# Patient Record
Sex: Female | Born: 1995 | Race: Black or African American | Hispanic: No | Marital: Single | Smoking: Current every day smoker
Health system: Southern US, Community
[De-identification: ages and names within clinical notes are randomized; demographics above are authoritative.]

## PROBLEM LIST (undated history)

## (undated) DIAGNOSIS — E079 Disorder of thyroid, unspecified: Secondary | ICD-10-CM

## (undated) HISTORY — PX: MOUTH SURGERY: SHX715

## (undated) HISTORY — DX: Disorder of thyroid, unspecified: E07.9

---
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
## 2014-01-28 ENCOUNTER — Ambulatory Visit: Payer: Medicaid Other | Admitting: Obstetrics & Gynecology

## 2014-06-06 ENCOUNTER — Ambulatory Visit: Payer: Medicaid Other | Admitting: Obstetrics & Gynecology

## 2014-08-20 ENCOUNTER — Encounter (HOSPITAL_COMMUNITY): Payer: Self-pay | Admitting: Emergency Medicine

## 2014-08-20 ENCOUNTER — Emergency Department (HOSPITAL_COMMUNITY)
Admission: EM | Admit: 2014-08-20 | Discharge: 2014-08-20 | Disposition: A | Discharge status: Home / Self Care | Payer: BC Managed Care – PPO | Source: Home / Self Care | Attending: Family Medicine | Admitting: Family Medicine

## 2014-08-20 DIAGNOSIS — S90852A Superficial foreign body, left foot, initial encounter: Secondary | ICD-10-CM

## 2014-08-20 NOTE — Discharge Instructions (Signed)
Sliver Removal °You have had a sliver (splinter) removed. This has caused a wound that extends through some or all layers of the skin and possibly into the subcutaneous tissue. This is the tissue just beneath the skin. Because these wounds can not be cleaned well, it is necessary to watch closely for infection. °AFTER THE PROCEDURE  °If a cut (incision) was necessary to remove this, it may have been repaired for you by your caregiver either with suturing, stapling, or adhesive strips. These keep together the skin edges and allow better and faster healing. °HOME CARE INSTRUCTIONS  °· A dressing may have been applied. This may be changed once per day or as instructed. If the dressing sticks, it may be soaked off with a gauze pad or clean cloth that has been dampened with soapy water or hydrogen peroxide. °· It is difficult to remove all slivers or foreign bodies as they may break or splinter into smaller pieces. Be aware that your body will work to remove the foreign substance. That is, the foreign body may work itself out of the wound. That is normal. °· Watch for signs of infection and notify your caregiver if you suspect a sliver or foreign body remains in the wound. °· You may have received a recommendation to follow up with your physician or a specialist. It is very important to call for or keep follow-up appointments in order to avoid infection or other complications. °· Only take over-the-counter or prescription medicines for pain, discomfort, or fever as directed by your caregiver. °· If antibiotics were prescribed, be sure to finish all of the medicine. °If you did not receive a tetanus shot today because you did not recall when your last one was given, check with your caregiver in the next day or two during follow up to determine if one is needed. °SEEK MEDICAL CARE IF:  °· The area around the wound has new or worsening redness or tenderness. °· Pus is coming from the wound °· There is a foul smell from the  wound or dressing °· The edges of a wound that had been repaired break open °SEEK IMMEDIATE MEDICAL CARE IF:  °· Red streaks are coming from the wound °· An unexplained oral temperature above 102° F (38.9° C) develops. °Document Released: 08/27/2000 Document Revised: 11/22/2011 Document Reviewed: 04/15/2008 °ExitCare® Patient Information ©2015 [employer]. This information is not intended to replace advice given to you by your health care provider. Make sure you discuss any questions you have with your health care provider. ° °

## 2014-08-20 NOTE — ED Notes (Signed)
Reports stepping on glass one month ago.  States having pain and swelling in left heel.
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
## 2014-09-09 ENCOUNTER — Encounter: Payer: Self-pay | Admitting: *Deleted

## 2014-09-10 ENCOUNTER — Encounter: Payer: Self-pay | Admitting: Obstetrics & Gynecology

## 2014-11-26 ENCOUNTER — Encounter (HOSPITAL_COMMUNITY): Payer: Self-pay | Admitting: Emergency Medicine

## 2014-11-26 ENCOUNTER — Emergency Department (HOSPITAL_COMMUNITY)
Admission: EM | Admit: 2014-11-26 | Discharge: 2014-11-26 | Disposition: A | Discharge status: Home / Self Care | Payer: BLUE CROSS/BLUE SHIELD | Attending: Emergency Medicine | Admitting: Emergency Medicine

## 2014-11-26 DIAGNOSIS — Y9389 Activity, other specified: Secondary | ICD-10-CM | POA: Diagnosis not present

## 2014-11-26 DIAGNOSIS — S199XXA Unspecified injury of neck, initial encounter: Secondary | ICD-10-CM | POA: Diagnosis present

## 2014-11-26 DIAGNOSIS — Y998 Other external cause status: Secondary | ICD-10-CM | POA: Insufficient documentation

## 2014-11-26 DIAGNOSIS — S161XXA Strain of muscle, fascia and tendon at neck level, initial encounter: Secondary | ICD-10-CM | POA: Insufficient documentation

## 2014-11-26 DIAGNOSIS — Z79899 Other long term (current) drug therapy: Secondary | ICD-10-CM | POA: Insufficient documentation

## 2014-11-26 DIAGNOSIS — Z8639 Personal history of other endocrine, nutritional and metabolic disease: Secondary | ICD-10-CM | POA: Diagnosis not present

## 2014-11-26 DIAGNOSIS — Y92410 Unspecified street and highway as the place of occurrence of the external cause: Secondary | ICD-10-CM | POA: Diagnosis not present

## 2014-11-26 DIAGNOSIS — T148XXA Other injury of unspecified body region, initial encounter: Secondary | ICD-10-CM

## 2014-11-26 MED ORDER — IBUPROFEN 800 MG PO TABS: 800.0000 mg | ORAL_TABLET | Freq: Three times a day (TID) | ORAL | Status: DC

## 2014-11-26 MED ORDER — CYCLOBENZAPRINE HCL 10 MG PO TABS: 10.0000 mg | ORAL_TABLET | Freq: Two times a day (BID) | ORAL | Status: DC | PRN
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
## 2014-11-26 NOTE — Discharge Instructions (Signed)
Cryotherapy °Cryotherapy means treatment with cold. Ice or gel packs can be used to reduce both pain and swelling. Ice is the most helpful within the first 24 to 48 hours after an injury or flare-up from overusing a muscle or joint. Sprains, strains, spasms, burning pain, shooting pain, and aches can all be eased with ice. Ice can also be used when recovering from surgery. Ice is effective, has very few side effects, and is safe for most people to use. °PRECAUTIONS  °Ice is not a safe treatment option for people with: °· Raynaud phenomenon. This is a condition affecting small blood vessels in the extremities. Exposure to cold may cause your problems to return. °· Cold hypersensitivity. There are many forms of cold hypersensitivity, including: °¨ Cold urticaria. Red, itchy hives appear on the skin when the tissues begin to warm after being iced. °¨ Cold erythema. This is a red, itchy rash caused by exposure to cold. °¨ Cold hemoglobinuria. Red blood cells break down when the tissues begin to warm after being iced. The hemoglobin that carry oxygen are passed into the urine because they cannot combine with blood proteins fast enough. °· Numbness or altered sensitivity in the area being iced. °If you have any of the following conditions, do not use ice until you have discussed cryotherapy with your caregiver: °· Heart conditions, such as arrhythmia, angina, or chronic heart disease. °· High blood pressure. °· Healing wounds or open skin in the area being iced. °· Current infections. °· Rheumatoid arthritis. °· Poor circulation. °· Diabetes. °Ice slows the blood flow in the region it is applied. This is beneficial when trying to stop inflamed tissues from spreading irritating chemicals to surrounding tissues. However, if you expose your skin to cold temperatures for too long or without the proper protection, you can damage your skin or nerves. Watch for signs of skin damage due to cold. °HOME CARE INSTRUCTIONS °Follow  these tips to use ice and cold packs safely. °· Place a dry or damp towel between the ice and skin. A damp towel will cool the skin more quickly, so you may need to shorten the time that the ice is used. °· For a more rapid response, add gentle compression to the ice. °· Ice for no more than 10 to 20 minutes at a time. The bonier the area you are icing, the less time it will take to get the benefits of ice. °· Check your skin after 5 minutes to make sure there are no signs of a poor response to cold or skin damage. °· Rest 20 minutes or more between uses. °· Once your skin is numb, you can end your treatment. You can test numbness by very lightly touching your skin. The touch should be so light that you do not see the skin dimple from the pressure of your fingertip. When using ice, most people will feel these normal sensations in this order: cold, burning, aching, and numbness. °· Do not use ice on someone who cannot communicate their responses to pain, such as small children or people with dementia. °HOW TO MAKE AN ICE PACK °Ice packs are the most common way to use ice therapy. Other methods include ice massage, ice baths, and cryosprays. Muscle creams that cause a cold, tingly feeling do not offer the same benefits that ice offers and should not be used as a substitute unless recommended by your caregiver. °To make an ice pack, do one of the following: °· Place crushed ice or a   bag of frozen vegetables in a sealable plastic bag. Squeeze out the excess air. Place this bag inside another plastic bag. Slide the bag into a pillowcase or place a damp towel between your skin and the bag. °· Mix 3 parts water with 1 part rubbing alcohol. Freeze the mixture in a sealable plastic bag. When you remove the mixture from the freezer, it will be slushy. Squeeze out the excess air. Place this bag inside another plastic bag. Slide the bag into a pillowcase or place a damp towel between your skin and the bag. °SEEK MEDICAL CARE  IF: °· You develop white spots on your skin. This may give the skin a blotchy (mottled) appearance. °· Your skin turns blue or pale. °· Your skin becomes waxy or hard. °· Your swelling gets worse. °MAKE SURE YOU:  °· Understand these instructions. °· Will watch your condition. °· Will get help right away if you are not doing well or get worse. °Document Released: 04/26/2011 Document Revised: 01/14/2014 Document Reviewed: 04/26/2011 °[employer]® Patient Information ©2015 [employer]. This information is not intended to replace advice given to you by your health care provider. Make sure you discuss any questions you have with your health care provider. °Motor Vehicle Collision °It is common to have multiple bruises and sore muscles after a motor vehicle collision (MVC). These tend to feel worse for the first 24 hours. You may have the most stiffness and soreness over the first several hours. You may also feel worse when you wake up the first morning after your collision. After this point, you will usually begin to improve with each day. The speed of improvement often depends on the severity of the collision, the number of injuries, and the location and nature of these injuries. °HOME CARE INSTRUCTIONS °· Put ice on the injured area. °¨ Put ice in a plastic bag. °¨ Place a towel between your skin and the bag. °¨ Leave the ice on for 15-20 minutes, 3-4 times a day, or as directed by your health care provider. °· Drink enough fluids to keep your urine clear or pale yellow. Do not drink alcohol. °· Take a warm shower or bath once or twice a day. This will increase blood flow to sore muscles. °· You may return to activities as directed by your caregiver. Be careful when lifting, as this may aggravate neck or back pain. °· Only take over-the-counter or prescription medicines for pain, discomfort, or fever as directed by your caregiver. Do not use aspirin. This may increase bruising and bleeding. °SEEK IMMEDIATE MEDICAL CARE  IF: °· You have numbness, tingling, or weakness in the arms or legs. °· You develop severe headaches not relieved with medicine. °· You have severe neck pain, especially tenderness in the middle of the back of your neck. °· You have changes in bowel or bladder control. °· There is increasing pain in any area of the body. °· You have shortness of breath, light-headedness, dizziness, or fainting. °· You have chest pain. °· You feel sick to your stomach (nauseous), throw up (vomit), or sweat. °· You have increasing abdominal discomfort. °· There is blood in your urine, stool, or vomit. °· You have pain in your shoulder (shoulder strap areas). °· You feel your symptoms are getting worse. °MAKE SURE YOU: °· Understand these instructions. °· Will watch your condition. °· Will get help right away if you are not doing well or get worse. °Document Released: 08/30/2005 Document Revised: 01/14/2014 Document Reviewed: 01/27/2011 °[employer]® Patient   Information ©2015 [employer]. This information is not intended to replace advice given to you by your health care provider. Make sure you discuss any questions you have with your health care provider. ° °

## 2014-12-19 ENCOUNTER — Other Ambulatory Visit: Payer: Self-pay | Admitting: Chiropractic Medicine

## 2014-12-19 ENCOUNTER — Ambulatory Visit
Admission: RE | Admit: 2014-12-19 | Discharge: 2014-12-19 | Disposition: A | Discharge status: Home / Self Care | Payer: BLUE CROSS/BLUE SHIELD | Source: Ambulatory Visit | Attending: Chiropractic Medicine | Admitting: Chiropractic Medicine

## 2014-12-19 DIAGNOSIS — M542 Cervicalgia: Secondary | ICD-10-CM

## 2015-04-09 ENCOUNTER — Ambulatory Visit: Payer: BLUE CROSS/BLUE SHIELD | Admitting: Certified Nurse Midwife

## 2015-04-09 ENCOUNTER — Ambulatory Visit (INDEPENDENT_AMBULATORY_CARE_PROVIDER_SITE_OTHER): Payer: BLUE CROSS/BLUE SHIELD | Admitting: Certified Nurse Midwife

## 2015-04-09 ENCOUNTER — Encounter: Payer: Self-pay | Admitting: Certified Nurse Midwife

## 2015-04-09 VITALS — BP 114/86 | HR 66 | Temp 98.8°F | Ht 67.0 in | Wt 130.0 lb

## 2015-04-09 DIAGNOSIS — E2839 Other primary ovarian failure: Secondary | ICD-10-CM

## 2015-04-09 DIAGNOSIS — E288 Other ovarian dysfunction: Secondary | ICD-10-CM

## 2015-04-09 MED ORDER — ESTRADIOL 0.1 MG/24HR TD PTWK: 0.1000 mg | MEDICATED_PATCH | TRANSDERMAL | Status: DC

## 2015-04-09 NOTE — Progress Notes (Signed)
Patient ID: Ariel Norris, female   DOB: [birth year], 19 y.o.   MRN: [id number]   Chief Complaint  Patient presents with  . Problem    HPI Ariel Norris is a 19 y.o. female.  Here with c/o mood changes, changes in weight (loss), irritability, lack of appetite, difficulty concentrating.  Has nexplanon (07/14/13) and is not having periods. 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  Has seen on campus [facility] clinic for routine exams.  Last STD cultures at [facility] were negative January 2016.  Currently sexually active with same partner.     HPI  Past Medical History  Diagnosis Date  . Thyroid disease     Past Surgical History  Procedure Laterality Date  . Mouth surgery      Family History  Problem Relation Age of Onset  . Stroke Mother   . Hyperlipidemia Father     Social History History  Substance Use Topics  . Smoking status: Never Smoker   . Smokeless tobacco: Not on file  . Alcohol Use: 0.0 oz/week    0 Standard drinks or equivalent per week     Comment: Socially    No Known Allergies  Current Outpatient Prescriptions  Medication Sig Dispense Refill  . estradiol (CLIMARA) 0.1 mg/24hr patch Place 1 patch (0.1 mg total) onto the skin once a week. 4 patch 12   No current facility-administered medications for this visit.    Review of Systems Review of Systems Constitutional: negative for fatigue and + for weight loss Respiratory: negative for cough and wheezing Cardiovascular: negative for chest pain, fatigue and palpitations Gastrointestinal: negative for abdominal pain and change in bowel habits Genitourinary:negative Integument/breast: negative for nipple discharge Musculoskeletal:negative for myalgias Neurological: negative for gait problems and tremors Behavioral/Psych: negative for abusive relationship, depression Endocrine: negative for temperature intolerance     Blood pressure 114/86, pulse 66, temperature 98.8 F (37.1  C), height 5\' 7"  (1.702 m), weight 130 lb (58.968 kg).  Physical Exam Physical Exam General:   alert  Skin:   no rash or abnormalities  Lungs:   clear to auscultation bilaterally  Heart:   regular rate and rhythm, S1, S2 normal, no murmur, click, rub or gallop  Breasts:   deferred  Abdomen:  normal findings: no organomegaly, soft, non-tender and no hernia  Pelvis:  deferred    90% of 15 min visit spent on counseling and coordination of care.   Data Reviewed Previous medical hx, labs, meds  Assessment     Premature ovarian failure hx     Plan    Orders Placed This Encounter  Procedures  . DG Bone Density    EPIC ORDER/ WT-130LBS/NO PREV DEXA/ NO NEEDS/INS-BCBS/CLC/BARB    Standing Status: Future     Number of Occurrences:      Standing Expiration Date: 06/09/2016    Order Specific Question:  Reason for Exam (SYMPTOM  OR DIAGNOSIS REQUIRED)    Answer:  ESTROGEN DEFIENCY/H/O Premature ovarian failure    Order Specific Question:  Is the patient pregnant?    Answer:  No    Order Specific Question:  Preferred imaging location?    Answer:  [facility]  . Prolactin  . Testosterone, Free, Total, SHBG  . 17-Hydroxyprogesterone  . Progesterone  . CBC with Differential/Platelet  . Comprehensive metabolic panel  . Cholesterol, total  . Triglycerides  . HDL cholesterol  . Thyroid Panel With TSH  . T4, free   Meds  ordered this encounter  Medications  . estradiol (CLIMARA) 0.1 mg/24hr patch    Sig: Place 1 patch (0.1 mg total) onto the skin once a week.    Dispense:  4 patch    Refill:  12    Need to obtain previous records Possible management options include: Endocrinology consult Follow up as needed.

## 2015-04-10 LAB — CBC WITH DIFFERENTIAL/PLATELET
Basophils Absolute: 0 10*3/uL (ref 0.0–0.1)
Basophils Relative: 0 % (ref 0–1)
EOS PCT: 1 % (ref 0–5)
Eosinophils Absolute: 0 10*3/uL (ref 0.0–0.7)
HCT: 37.7 % (ref 36.0–46.0)
Hemoglobin: 12.3 g/dL (ref 12.0–15.0)
Lymphocytes Relative: 38 % (ref 12–46)
Lymphs Abs: 1.7 10*3/uL (ref 0.7–4.0)
MCH: 28.7 pg (ref 26.0–34.0)
MCHC: 32.6 g/dL (ref 30.0–36.0)
MCV: 88.1 fL (ref 78.0–100.0)
MONO ABS: 0.3 10*3/uL (ref 0.1–1.0)
MONOS PCT: 6 % (ref 3–12)
MPV: 10.6 fL (ref 8.6–12.4)
NEUTROS ABS: 2.5 10*3/uL (ref 1.7–7.7)
Neutrophils Relative %: 55 % (ref 43–77)
Platelets: 276 10*3/uL (ref 150–400)
RBC: 4.28 MIL/uL (ref 3.87–5.11)
RDW: 13.6 % (ref 11.5–15.5)
WBC: 4.6 10*3/uL (ref 4.0–10.5)

## 2015-04-10 LAB — PROLACTIN: PROLACTIN: 13.3 ng/mL

## 2015-04-10 LAB — COMPREHENSIVE METABOLIC PANEL
ALK PHOS: 48 U/L (ref 33–115)
ALT: 9 U/L (ref 6–29)
AST: 18 U/L (ref 10–30)
Albumin: 4.3 g/dL (ref 3.6–5.1)
BUN: 10 mg/dL (ref 7–25)
CO2: 25 mEq/L (ref 20–31)
CREATININE: 0.8 mg/dL (ref 0.50–1.10)
Calcium: 10 mg/dL (ref 8.6–10.2)
Chloride: 105 mEq/L (ref 98–110)
Glucose, Bld: 77 mg/dL (ref 65–99)
Potassium: 4.4 mEq/L (ref 3.5–5.3)
Sodium: 138 mEq/L (ref 135–146)
Total Bilirubin: 0.6 mg/dL (ref 0.2–1.2)
Total Protein: 7.3 g/dL (ref 6.1–8.1)

## 2015-04-10 LAB — TESTOSTERONE, FREE, TOTAL, SHBG
Sex Hormone Binding: 53 nmol/L (ref 17–124)
TESTOSTERONE-% FREE: 1.3 % (ref 0.4–2.4)
Testosterone, Free: 2.4 pg/mL (ref 0.6–6.8)
Testosterone: 18 ng/dL (ref 10–70)

## 2015-04-10 LAB — THYROID PANEL WITH TSH
Free Thyroxine Index: 2.2 (ref 1.4–3.8)
T3 UPTAKE: 27 % (ref 22–35)
T4, Total: 8.2 ug/dL (ref 4.5–12.0)
TSH: 1.364 u[IU]/mL (ref 0.350–4.500)

## 2015-04-10 LAB — HDL CHOLESTEROL: HDL: 74 mg/dL (ref >=46)

## 2015-04-10 LAB — PROGESTERONE: Progesterone: 0.4 ng/mL

## 2015-04-10 LAB — CHOLESTEROL, TOTAL: Cholesterol: 139 mg/dL (ref 125–170)

## 2015-04-10 LAB — T4, FREE: FREE T4: 0.95 ng/dL (ref 0.80–1.80)

## 2015-04-10 LAB — TRIGLYCERIDES: Triglycerides: 45 mg/dL (ref <150)

## 2015-04-13 LAB — 17-HYDROXYPROGESTERONE: 17-OH-Progesterone, LC/MS/MS: 17 ng/dL

## 2015-04-17 ENCOUNTER — Inpatient Hospital Stay: Admission: RE | Admit: 2015-04-17 | Payer: Self-pay | Source: Ambulatory Visit

## 2015-07-09 ENCOUNTER — Encounter (HOSPITAL_COMMUNITY): Payer: Self-pay | Admitting: Emergency Medicine

## 2015-07-09 ENCOUNTER — Emergency Department (HOSPITAL_COMMUNITY)
Admission: EM | Admit: 2015-07-09 | Discharge: 2015-07-10 | Disposition: A | Discharge status: Home / Self Care | Payer: Self-pay | Attending: Emergency Medicine | Admitting: Emergency Medicine

## 2015-07-09 ENCOUNTER — Ambulatory Visit (HOSPITAL_COMMUNITY)
Admission: EM | Admit: 2015-07-09 | Discharge: 2015-07-09 | Disposition: A | Discharge status: Home / Self Care | Payer: No Typology Code available for payment source | Source: Ambulatory Visit | Attending: Emergency Medicine | Admitting: Emergency Medicine

## 2015-07-09 DIAGNOSIS — Y9289 Other specified places as the place of occurrence of the external cause: Secondary | ICD-10-CM | POA: Insufficient documentation

## 2015-07-09 DIAGNOSIS — Z720 Tobacco use: Secondary | ICD-10-CM | POA: Insufficient documentation

## 2015-07-09 DIAGNOSIS — Z8639 Personal history of other endocrine, nutritional and metabolic disease: Secondary | ICD-10-CM | POA: Insufficient documentation

## 2015-07-09 DIAGNOSIS — Y998 Other external cause status: Secondary | ICD-10-CM | POA: Insufficient documentation

## 2015-07-09 DIAGNOSIS — Y9389 Activity, other specified: Secondary | ICD-10-CM | POA: Insufficient documentation

## 2015-07-09 DIAGNOSIS — T7421XA Adult sexual abuse, confirmed, initial encounter: Secondary | ICD-10-CM | POA: Insufficient documentation

## 2015-07-09 DIAGNOSIS — Z3202 Encounter for pregnancy test, result negative: Secondary | ICD-10-CM | POA: Insufficient documentation

## 2015-07-09 MED ORDER — LIDOCAINE HCL (PF) 1 % IJ SOLN
2.0000 mL | Freq: Once | INTRAMUSCULAR | Status: AC
  Administered 2015-07-09: 2 mL
  Filled 2015-07-09: qty 5

## 2015-07-09 MED ORDER — CEFTRIAXONE SODIUM 250 MG IJ SOLR
250.0000 mg | Freq: Once | INTRAMUSCULAR | Status: AC
  Administered 2015-07-09: 250 mg via INTRAMUSCULAR
  Filled 2015-07-09: qty 250

## 2015-07-09 MED ORDER — AZITHROMYCIN 250 MG PO TABS
1000.0000 mg | ORAL_TABLET | Freq: Once | ORAL | Status: AC
  Administered 2015-07-09: 1000 mg via ORAL
  Filled 2015-07-09: qty 4

## 2015-07-09 MED ORDER — METRONIDAZOLE 500 MG PO TABS
2000.0000 mg | ORAL_TABLET | Freq: Once | ORAL | Status: AC
  Administered 2015-07-09: 2000 mg via ORAL
  Filled 2015-07-09: qty 4

## 2015-07-09 NOTE — ED Notes (Signed)
SANE RN at bedside.
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
## 2015-07-10 ENCOUNTER — Encounter: Payer: Self-pay | Admitting: Certified Nurse Midwife

## 2015-07-10 ENCOUNTER — Ambulatory Visit (INDEPENDENT_AMBULATORY_CARE_PROVIDER_SITE_OTHER): Payer: BLUE CROSS/BLUE SHIELD | Admitting: Certified Nurse Midwife

## 2015-07-10 VITALS — BP 102/71 | HR 85 | Temp 98.1°F | Wt 134.0 lb

## 2015-07-10 DIAGNOSIS — E288 Other ovarian dysfunction: Secondary | ICD-10-CM

## 2015-07-10 DIAGNOSIS — E2839 Other primary ovarian failure: Secondary | ICD-10-CM

## 2015-07-10 LAB — POC URINE PREG, ED: Preg Test, Ur: NEGATIVE

## 2015-07-10 MED ORDER — ESTRADIOL 0.1 MG/24HR TD PTTW: 1.0000 | MEDICATED_PATCH | TRANSDERMAL | Status: AC
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
## 2015-07-24 ENCOUNTER — Ambulatory Visit (INDEPENDENT_AMBULATORY_CARE_PROVIDER_SITE_OTHER): Payer: BLUE CROSS/BLUE SHIELD | Admitting: Certified Nurse Midwife

## 2015-07-24 ENCOUNTER — Encounter: Payer: Self-pay | Admitting: Certified Nurse Midwife

## 2015-07-24 VITALS — BP 117/80 | HR 73 | Wt 138.0 lb

## 2015-07-24 DIAGNOSIS — Z3046 Encounter for surveillance of implantable subdermal contraceptive: Secondary | ICD-10-CM

## 2015-07-24 DIAGNOSIS — Z3049 Encounter for surveillance of other contraceptives: Secondary | ICD-10-CM | POA: Diagnosis not present

## 2015-07-24 DIAGNOSIS — E2839 Other primary ovarian failure: Secondary | ICD-10-CM

## 2015-07-24 DIAGNOSIS — E288 Other ovarian dysfunction: Secondary | ICD-10-CM

## 2015-07-24 DIAGNOSIS — N951 Menopausal and female climacteric states: Secondary | ICD-10-CM

## 2015-07-24 NOTE — Progress Notes (Signed)
Patient ID: Ariel Norris, female   DOB: 03/17/1996, 19 y.o.   MRN: [id number]  NEXPLANON REMOVAL NOTE  Date of LMP:   unknown  Contraception used: *Nexplanon   Indications:  The patient desires contraception.  She understands risks, benefits, and alternatives to Implanon and would like to proceed.  Anesthesia:   Lidocaine 1% plain.  Procedure:  A time-out was performed confirming the procedure and the patient's allergy status.  Complications: None                      The rod was palpated and the area was sterilely prepped.  The area beneath the distal tip was anesthetized with 1% xylocaine and the skin incised                       Over the tip and the tip was exposed, grasped with forcep and removed intact.  A single suture of 4-0 Vicryl was used to close incision.  Steri strip                       And a bandage applied and the arm was wrapped with gauze bandage.  The patient tolerated well.  Instructions:  The patient was instructed to remove the dressing in 24 hours and that some bruising is to be expected.  She was advised to use over the counter analgesics as needed for any pain at the site.  She is to keep the area dry for 24 hours and to call if her hand or arm becomes cold, numb, or blue.  Return visit:  Return in 6+ weeks for f/u on contraception and hormonal treatment options for premature ovarian failure.

## 2015-09-04 ENCOUNTER — Ambulatory Visit: Payer: BLUE CROSS/BLUE SHIELD | Admitting: Certified Nurse Midwife

## 2015-09-05 ENCOUNTER — Telehealth (HOSPITAL_BASED_OUTPATIENT_CLINIC_OR_DEPARTMENT_OTHER): Payer: Self-pay | Admitting: Emergency Medicine
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
## 2016-05-31 ENCOUNTER — Encounter: Payer: BLUE CROSS/BLUE SHIELD | Admitting: Podiatry

## 2016-10-08 NOTE — Progress Notes (Signed)
This encounter was created in error - please disregard.

## 2020-01-10 ENCOUNTER — Ambulatory Visit: Payer: No Typology Code available for payment source

## 2023-07-21 DIAGNOSIS — F411 Generalized anxiety disorder: Secondary | ICD-10-CM | POA: Diagnosis not present

## 2023-07-21 DIAGNOSIS — F331 Major depressive disorder, recurrent, moderate: Secondary | ICD-10-CM | POA: Diagnosis not present

## 2023-08-18 DIAGNOSIS — Z319 Encounter for procreative management, unspecified: Secondary | ICD-10-CM | POA: Diagnosis not present

## 2023-08-18 DIAGNOSIS — N941 Unspecified dyspareunia: Secondary | ICD-10-CM | POA: Diagnosis not present

## 2023-08-18 DIAGNOSIS — Z1159 Encounter for screening for other viral diseases: Secondary | ICD-10-CM | POA: Diagnosis not present

## 2023-08-18 DIAGNOSIS — N911 Secondary amenorrhea: Secondary | ICD-10-CM | POA: Diagnosis not present

## 2023-08-18 DIAGNOSIS — Z118 Encounter for screening for other infectious and parasitic diseases: Secondary | ICD-10-CM | POA: Diagnosis not present

## 2023-08-18 DIAGNOSIS — N760 Acute vaginitis: Secondary | ICD-10-CM | POA: Diagnosis not present

## 2023-08-25 DIAGNOSIS — F331 Major depressive disorder, recurrent, moderate: Secondary | ICD-10-CM | POA: Diagnosis not present

## 2023-08-25 DIAGNOSIS — F411 Generalized anxiety disorder: Secondary | ICD-10-CM | POA: Diagnosis not present

## 2023-08-26 DIAGNOSIS — R768 Other specified abnormal immunological findings in serum: Secondary | ICD-10-CM | POA: Diagnosis not present

## 2023-09-29 DIAGNOSIS — N760 Acute vaginitis: Secondary | ICD-10-CM | POA: Diagnosis not present

## 2023-09-29 DIAGNOSIS — F331 Major depressive disorder, recurrent, moderate: Secondary | ICD-10-CM | POA: Diagnosis not present

## 2023-09-29 DIAGNOSIS — E063 Autoimmune thyroiditis: Secondary | ICD-10-CM | POA: Diagnosis not present

## 2023-09-29 DIAGNOSIS — F411 Generalized anxiety disorder: Secondary | ICD-10-CM | POA: Diagnosis not present

## 2023-09-29 DIAGNOSIS — R768 Other specified abnormal immunological findings in serum: Secondary | ICD-10-CM | POA: Diagnosis not present

## 2023-09-29 DIAGNOSIS — R5383 Other fatigue: Secondary | ICD-10-CM | POA: Diagnosis not present

## 2023-09-29 DIAGNOSIS — N911 Secondary amenorrhea: Secondary | ICD-10-CM | POA: Diagnosis not present

## 2023-09-29 DIAGNOSIS — Z124 Encounter for screening for malignant neoplasm of cervix: Secondary | ICD-10-CM | POA: Diagnosis not present

## 2023-09-29 DIAGNOSIS — Z1331 Encounter for screening for depression: Secondary | ICD-10-CM | POA: Diagnosis not present

## 2023-09-29 DIAGNOSIS — Z01411 Encounter for gynecological examination (general) (routine) with abnormal findings: Secondary | ICD-10-CM | POA: Diagnosis not present

## 2023-09-30 DIAGNOSIS — F411 Generalized anxiety disorder: Secondary | ICD-10-CM | POA: Diagnosis not present

## 2023-09-30 DIAGNOSIS — F331 Major depressive disorder, recurrent, moderate: Secondary | ICD-10-CM | POA: Diagnosis not present

## 2023-10-27 DIAGNOSIS — E039 Hypothyroidism, unspecified: Secondary | ICD-10-CM | POA: Diagnosis not present

## 2023-10-28 DIAGNOSIS — N912 Amenorrhea, unspecified: Secondary | ICD-10-CM | POA: Diagnosis not present

## 2023-10-28 DIAGNOSIS — F411 Generalized anxiety disorder: Secondary | ICD-10-CM | POA: Diagnosis not present

## 2023-10-28 DIAGNOSIS — F331 Major depressive disorder, recurrent, moderate: Secondary | ICD-10-CM | POA: Diagnosis not present

## 2023-11-03 DIAGNOSIS — F411 Generalized anxiety disorder: Secondary | ICD-10-CM | POA: Diagnosis not present

## 2023-11-03 DIAGNOSIS — F331 Major depressive disorder, recurrent, moderate: Secondary | ICD-10-CM | POA: Diagnosis not present

## 2023-11-11 DIAGNOSIS — F331 Major depressive disorder, recurrent, moderate: Secondary | ICD-10-CM | POA: Diagnosis not present

## 2023-11-11 DIAGNOSIS — F411 Generalized anxiety disorder: Secondary | ICD-10-CM | POA: Diagnosis not present

## 2023-11-17 DIAGNOSIS — Z0000 Encounter for general adult medical examination without abnormal findings: Secondary | ICD-10-CM | POA: Diagnosis not present

## 2023-11-25 DIAGNOSIS — F411 Generalized anxiety disorder: Secondary | ICD-10-CM | POA: Diagnosis not present

## 2023-11-25 DIAGNOSIS — F331 Major depressive disorder, recurrent, moderate: Secondary | ICD-10-CM | POA: Diagnosis not present

## 2023-12-01 DIAGNOSIS — F411 Generalized anxiety disorder: Secondary | ICD-10-CM | POA: Diagnosis not present

## 2023-12-01 DIAGNOSIS — F331 Major depressive disorder, recurrent, moderate: Secondary | ICD-10-CM | POA: Diagnosis not present

## 2023-12-08 DIAGNOSIS — F411 Generalized anxiety disorder: Secondary | ICD-10-CM | POA: Diagnosis not present

## 2023-12-08 DIAGNOSIS — F331 Major depressive disorder, recurrent, moderate: Secondary | ICD-10-CM | POA: Diagnosis not present

## 2023-12-09 DIAGNOSIS — F4329 Adjustment disorder with other symptoms: Secondary | ICD-10-CM | POA: Diagnosis not present

## 2023-12-09 DIAGNOSIS — F411 Generalized anxiety disorder: Secondary | ICD-10-CM | POA: Diagnosis not present

## 2023-12-09 DIAGNOSIS — F331 Major depressive disorder, recurrent, moderate: Secondary | ICD-10-CM | POA: Diagnosis not present

## 2023-12-13 DIAGNOSIS — F331 Major depressive disorder, recurrent, moderate: Secondary | ICD-10-CM | POA: Diagnosis not present

## 2023-12-13 DIAGNOSIS — F411 Generalized anxiety disorder: Secondary | ICD-10-CM | POA: Diagnosis not present

## 2023-12-13 DIAGNOSIS — F4329 Adjustment disorder with other symptoms: Secondary | ICD-10-CM | POA: Diagnosis not present

## 2023-12-22 DIAGNOSIS — M546 Pain in thoracic spine: Secondary | ICD-10-CM | POA: Diagnosis not present

## 2023-12-22 DIAGNOSIS — M9904 Segmental and somatic dysfunction of sacral region: Secondary | ICD-10-CM | POA: Diagnosis not present

## 2023-12-22 DIAGNOSIS — M41126 Adolescent idiopathic scoliosis, lumbar region: Secondary | ICD-10-CM | POA: Diagnosis not present

## 2023-12-22 DIAGNOSIS — M40292 Other kyphosis, cervical region: Secondary | ICD-10-CM | POA: Diagnosis not present

## 2024-01-05 DIAGNOSIS — M9905 Segmental and somatic dysfunction of pelvic region: Secondary | ICD-10-CM | POA: Diagnosis not present

## 2024-01-05 DIAGNOSIS — M9903 Segmental and somatic dysfunction of lumbar region: Secondary | ICD-10-CM | POA: Diagnosis not present

## 2024-01-05 DIAGNOSIS — M9902 Segmental and somatic dysfunction of thoracic region: Secondary | ICD-10-CM | POA: Diagnosis not present

## 2024-01-05 DIAGNOSIS — M9901 Segmental and somatic dysfunction of cervical region: Secondary | ICD-10-CM | POA: Diagnosis not present

## 2024-01-06 DIAGNOSIS — F331 Major depressive disorder, recurrent, moderate: Secondary | ICD-10-CM | POA: Diagnosis not present

## 2024-01-06 DIAGNOSIS — F411 Generalized anxiety disorder: Secondary | ICD-10-CM | POA: Diagnosis not present

## 2024-01-06 DIAGNOSIS — F4329 Adjustment disorder with other symptoms: Secondary | ICD-10-CM | POA: Diagnosis not present

## 2024-01-16 DIAGNOSIS — M9905 Segmental and somatic dysfunction of pelvic region: Secondary | ICD-10-CM | POA: Diagnosis not present

## 2024-01-16 DIAGNOSIS — M9903 Segmental and somatic dysfunction of lumbar region: Secondary | ICD-10-CM | POA: Diagnosis not present

## 2024-01-16 DIAGNOSIS — M9902 Segmental and somatic dysfunction of thoracic region: Secondary | ICD-10-CM | POA: Diagnosis not present

## 2024-01-16 DIAGNOSIS — M9901 Segmental and somatic dysfunction of cervical region: Secondary | ICD-10-CM | POA: Diagnosis not present

## 2024-01-19 DIAGNOSIS — M9901 Segmental and somatic dysfunction of cervical region: Secondary | ICD-10-CM | POA: Diagnosis not present

## 2024-01-19 DIAGNOSIS — M9902 Segmental and somatic dysfunction of thoracic region: Secondary | ICD-10-CM | POA: Diagnosis not present

## 2024-01-19 DIAGNOSIS — M9903 Segmental and somatic dysfunction of lumbar region: Secondary | ICD-10-CM | POA: Diagnosis not present

## 2024-01-19 DIAGNOSIS — M9905 Segmental and somatic dysfunction of pelvic region: Secondary | ICD-10-CM | POA: Diagnosis not present

## 2024-01-23 DIAGNOSIS — M9905 Segmental and somatic dysfunction of pelvic region: Secondary | ICD-10-CM | POA: Diagnosis not present

## 2024-01-23 DIAGNOSIS — M9903 Segmental and somatic dysfunction of lumbar region: Secondary | ICD-10-CM | POA: Diagnosis not present

## 2024-01-23 DIAGNOSIS — M9901 Segmental and somatic dysfunction of cervical region: Secondary | ICD-10-CM | POA: Diagnosis not present

## 2024-01-23 DIAGNOSIS — M9902 Segmental and somatic dysfunction of thoracic region: Secondary | ICD-10-CM | POA: Diagnosis not present

## 2024-01-27 DIAGNOSIS — F331 Major depressive disorder, recurrent, moderate: Secondary | ICD-10-CM | POA: Diagnosis not present

## 2024-01-27 DIAGNOSIS — F411 Generalized anxiety disorder: Secondary | ICD-10-CM | POA: Diagnosis not present

## 2024-02-03 DIAGNOSIS — F411 Generalized anxiety disorder: Secondary | ICD-10-CM | POA: Diagnosis not present

## 2024-02-03 DIAGNOSIS — F331 Major depressive disorder, recurrent, moderate: Secondary | ICD-10-CM | POA: Diagnosis not present

## 2024-02-03 DIAGNOSIS — F4329 Adjustment disorder with other symptoms: Secondary | ICD-10-CM | POA: Diagnosis not present

## 2024-02-24 DIAGNOSIS — N951 Menopausal and female climacteric states: Secondary | ICD-10-CM | POA: Diagnosis not present

## 2024-03-02 DIAGNOSIS — F411 Generalized anxiety disorder: Secondary | ICD-10-CM | POA: Diagnosis not present

## 2024-03-02 DIAGNOSIS — F331 Major depressive disorder, recurrent, moderate: Secondary | ICD-10-CM | POA: Diagnosis not present

## 2024-03-23 DIAGNOSIS — F4329 Adjustment disorder with other symptoms: Secondary | ICD-10-CM | POA: Diagnosis not present

## 2024-03-23 DIAGNOSIS — F411 Generalized anxiety disorder: Secondary | ICD-10-CM | POA: Diagnosis not present

## 2024-03-23 DIAGNOSIS — F331 Major depressive disorder, recurrent, moderate: Secondary | ICD-10-CM | POA: Diagnosis not present

## 2024-03-29 DIAGNOSIS — R768 Other specified abnormal immunological findings in serum: Secondary | ICD-10-CM | POA: Diagnosis not present

## 2024-03-29 DIAGNOSIS — R5383 Other fatigue: Secondary | ICD-10-CM | POA: Diagnosis not present

## 2024-04-13 DIAGNOSIS — F331 Major depressive disorder, recurrent, moderate: Secondary | ICD-10-CM | POA: Diagnosis not present

## 2024-04-13 DIAGNOSIS — F411 Generalized anxiety disorder: Secondary | ICD-10-CM | POA: Diagnosis not present

## 2024-04-27 DIAGNOSIS — F331 Major depressive disorder, recurrent, moderate: Secondary | ICD-10-CM | POA: Diagnosis not present

## 2024-04-27 DIAGNOSIS — F411 Generalized anxiety disorder: Secondary | ICD-10-CM | POA: Diagnosis not present

## 2024-07-06 DIAGNOSIS — F331 Major depressive disorder, recurrent, moderate: Secondary | ICD-10-CM | POA: Diagnosis not present

## 2024-07-06 DIAGNOSIS — F411 Generalized anxiety disorder: Secondary | ICD-10-CM | POA: Diagnosis not present

## 2024-07-19 DIAGNOSIS — J069 Acute upper respiratory infection, unspecified: Secondary | ICD-10-CM | POA: Diagnosis not present

## 2024-07-27 DIAGNOSIS — F330 Major depressive disorder, recurrent, mild: Secondary | ICD-10-CM | POA: Diagnosis not present

## 2024-07-27 DIAGNOSIS — F411 Generalized anxiety disorder: Secondary | ICD-10-CM | POA: Diagnosis not present
